# Patient Record
Sex: Male | Born: 1970 | Race: White | Hispanic: No | Marital: Married | Smoking: Current every day smoker
Health system: Southern US, Community
[De-identification: ages and names within clinical notes are randomized; demographics above are authoritative.]

## PROBLEM LIST (undated history)

## (undated) DIAGNOSIS — F1020 Alcohol dependence, uncomplicated: Secondary | ICD-10-CM

## (undated) DIAGNOSIS — R569 Unspecified convulsions: Secondary | ICD-10-CM

## (undated) DIAGNOSIS — G4730 Sleep apnea, unspecified: Secondary | ICD-10-CM

## (undated) HISTORY — PX: APPENDECTOMY: SHX54

## (undated) HISTORY — PX: OTHER SURGICAL HISTORY: SHX169

---

## 2012-04-15 ENCOUNTER — Emergency Department (HOSPITAL_COMMUNITY)
Admission: EM | Admit: 2012-04-15 | Discharge: 2012-04-15 | Disposition: A | Discharge status: Home / Self Care | Payer: Medicare Other | Attending: Emergency Medicine | Admitting: Emergency Medicine

## 2012-04-15 ENCOUNTER — Emergency Department (HOSPITAL_COMMUNITY): Payer: Medicare Other

## 2012-04-15 ENCOUNTER — Encounter (HOSPITAL_COMMUNITY): Payer: Self-pay

## 2012-04-15 DIAGNOSIS — F17200 Nicotine dependence, unspecified, uncomplicated: Secondary | ICD-10-CM | POA: Insufficient documentation

## 2012-04-15 DIAGNOSIS — G4730 Sleep apnea, unspecified: Secondary | ICD-10-CM | POA: Insufficient documentation

## 2012-04-15 DIAGNOSIS — R42 Dizziness and giddiness: Secondary | ICD-10-CM | POA: Insufficient documentation

## 2012-04-15 DIAGNOSIS — R079 Chest pain, unspecified: Secondary | ICD-10-CM | POA: Insufficient documentation

## 2012-04-15 DIAGNOSIS — R0602 Shortness of breath: Secondary | ICD-10-CM | POA: Insufficient documentation

## 2012-04-15 DIAGNOSIS — R569 Unspecified convulsions: Secondary | ICD-10-CM | POA: Insufficient documentation

## 2012-04-15 DIAGNOSIS — Z79899 Other long term (current) drug therapy: Secondary | ICD-10-CM | POA: Insufficient documentation

## 2012-04-15 HISTORY — DX: Unspecified convulsions: R56.9

## 2012-04-15 HISTORY — DX: Sleep apnea, unspecified: G47.30

## 2012-04-15 HISTORY — DX: Alcohol dependence, uncomplicated: F10.20

## 2012-04-15 LAB — CBC
MCH: 29.5 pg (ref 26.0–34.0)
MCHC: 34.1 g/dL (ref 30.0–36.0)
MCV: 86.6 fL (ref 78.0–100.0)
Platelets: 228 10*3/uL (ref 150–400)
RDW: 13.4 % (ref 11.5–15.5)

## 2012-04-15 LAB — BASIC METABOLIC PANEL
CO2: 19 mEq/L (ref 19–32)
Calcium: 8.7 mg/dL (ref 8.4–10.5)
Creatinine, Ser: 1.02 mg/dL (ref 0.50–1.35)
Glucose, Bld: 85 mg/dL (ref 70–99)

## 2012-04-15 LAB — URINALYSIS, ROUTINE W REFLEX MICROSCOPIC
Glucose, UA: NEGATIVE mg/dL
Hgb urine dipstick: NEGATIVE
Ketones, ur: NEGATIVE mg/dL
Leukocytes, UA: NEGATIVE
Protein, ur: NEGATIVE mg/dL

## 2012-04-15 MED ORDER — SODIUM CHLORIDE 0.9 % IV BOLUS (SEPSIS)
500.0000 mL | Freq: Once | INTRAVENOUS | Status: AC
  Administered 2012-04-15: 500 mL via INTRAVENOUS

## 2012-04-15 NOTE — ED Provider Notes (Signed)
History     CSN: [id number]  Arrival date & time 04/15/12  1420   First MD Initiated Contact with Patient 04/15/12 1537      Chief Complaint  Patient presents with  . Weakness  . Dizziness  . Dehydration    (Consider location/radiation/quality/duration/timing/severity/associated sxs/prior treatment) HPI Comments: Patient presents today with a chief complaint of chest pain.  He reports that the pain came on suddenly this morning around 11:30 AM while he was in class.  He was not doing anything exertional at the onset of pain.  Pain located in the substernal region and did not radiate.  He describes the pain by stating, "It felt like someone kicked me in the chest."  Pain lasted approximately 30 minutes and then resolved without intervention.  He states that he felt like he had difficulty catching his breath at the time of this pain.  He also had some lightheadedness associated with this pain.  He denies numbness or tingling.  No nausea or vomiting.  He was seen by [facility] and given ASA.  No prior cardiac history.  No history of HTN, Hyperlipidemia, or DM.  He reports that his father had a MI when he was in his 82's.  He has never had any cardiac testing.  He denies prolonged travel or surgery in the past 4 weeks.  No prior history of DVT or PE.  No history of Cancer.  He denies LE edema or pain.    The history is provided by the patient.    Past Medical History  Diagnosis Date  . Seizures   . EtOH dependence     quit 2 years ago  . Sleep apnea     Past Surgical History  Procedure Date  . Vns     nerve stimulator for seizure- [facility]  . Appendectomy     No family history on file.  History  Substance Use Topics  . Smoking status: Current Every Day Smoker  . Smokeless tobacco: Not on file  . Alcohol Use: No      Review of Systems  Constitutional: Negative for fever and chills.  HENT: Negative for neck pain and neck stiffness.   Respiratory: Positive for shortness of  breath. Negative for cough and wheezing.   Cardiovascular: Positive for chest pain. Negative for palpitations and leg swelling.  Gastrointestinal: Negative for nausea, vomiting and abdominal pain.  Neurological: Positive for dizziness and light-headedness. Negative for syncope, numbness and headaches.    Allergies  Cephalosporins  Home Medications   Current Outpatient Rx  Name  Route  Sig  Dispense  Refill  . LAMOTRIGINE 200 MG PO TABS   Oral   Take 200 mg by mouth 2 (two) times daily.         Marland Kitchen PREGABALIN 150 MG PO CAPS   Oral   Take 150 mg by mouth 2 (two) times daily.         . TOPIRAMATE 100 MG PO TABS   Oral   Take 200 mg by mouth 2 (two) times daily.           BP 111/74  Pulse 75  Temp 98.3 F (36.8 C) (Oral)  Resp 20  SpO2 100%  Physical Exam  Nursing note and vitals reviewed. Constitutional: He appears well-nourished. No distress.  HENT:  Head: Normocephalic and atraumatic.  Mouth/Throat: Oropharynx is clear and moist.  Neck: Normal range of motion. Neck supple.  Cardiovascular: Normal rate, regular rhythm, normal heart sounds and intact  distal pulses.   Pulmonary/Chest: Effort normal and breath sounds normal. No respiratory distress. He has no wheezes. He has no rales. He exhibits no tenderness.  Abdominal: Soft. There is no tenderness.  Musculoskeletal:       No LE edema or pain  Neurological: He is alert.  Skin: Skin is warm and dry. No rash noted. He is not diaphoretic.  Psychiatric: He has a normal mood and affect.    ED Course  Procedures (including critical care time)  Labs Reviewed  BASIC METABOLIC PANEL - Abnormal; Notable for the following:    GFR calc non Af Amer 90 (*)     All other components within normal limits  URINALYSIS, ROUTINE W REFLEX MICROSCOPIC  CBC  TROPONIN I  GLUCOSE, CAPILLARY   Dg Chest Portable 1 View  04/15/2012  *RADIOLOGY REPORT*  Clinical Data: Weakness.  Dizziness.  Dehydration.  Chest pain.  PORTABLE  CHEST - 1 VIEW  Comparison: None.  Findings:  Cardiopericardial silhouette within normal limits. Mediastinal contours normal. Trachea midline.  No airspace disease or effusion.  Vagal nerve stimulator is present in the left chest.  IMPRESSION: No active cardiopulmonary disease.   Original Report Authenticated By: Andreas Newport, M.D.      No diagnosis found.   Date: 04/15/2012  Rate: 79  Rhythm: normal sinus rhythm  QRS Axis: normal  Intervals: normal  ST/T Wave abnormalities: normal  Conduction Disutrbances:none  Narrative Interpretation:   Old EKG Reviewed: none available  Patient discussed with Dr. Rulon Abide who also evaluated the patient.    MDM  Patient discharged with recommendation to follow up with PCP in regards to today's hospital visit for an outpatient stress test. Chest pain is not likely of cardiac or pulmonary etiology d/t presentation, PERC negative, VSS, heart RRR, breath sounds equal bilaterally, EKG without acute abnormalities, negative initial and 3 hour troponin, and negative CXR.  Return precautions discussed with patient.  Pt appears reliable for follow up and is agreeable to discharge.   Case has been discussed with and seen by Dr. Rulon Abide who agrees with the above plan to discharge.         Pascal Lux Brilliant, PA-C 04/15/12 2237

## 2012-04-15 NOTE — ED Notes (Signed)
MD at bedside. 

## 2012-04-15 NOTE — ED Notes (Signed)
MVH:[id number]<GE> Expected date:04/15/12<BR> Expected time: 1:55 PM<BR> Means of arrival:<BR> Comments:<BR> Syncope/positive orthostatics

## 2012-04-15 NOTE — ED Notes (Signed)
[facility] ASA given on scene

## 2012-04-15 NOTE — ED Notes (Signed)
Family at bedside.  Requesting to see EDP

## 2012-04-15 NOTE — ED Notes (Signed)
Per [facility]- Pt student at [facility]- in class while sitting pt c/o of center CP lasting . Pt positive orthostatic.

## 2012-04-15 NOTE — ED Provider Notes (Signed)
Medical screening examination/treatment/procedure(s) were performed by non-physician practitioner and as supervising physician I was immediately available for consultation/collaboration.  Jones Skene, M.D.     Jones Skene, MD 04/15/12 2308

## 2020-11-02 ENCOUNTER — Emergency Department (HOSPITAL_BASED_OUTPATIENT_CLINIC_OR_DEPARTMENT_OTHER)
Admission: EM | Admit: 2020-11-02 | Discharge: 2020-11-02 | Disposition: A | Discharge status: Home / Self Care | Payer: Medicare Other | Attending: Emergency Medicine | Admitting: Emergency Medicine

## 2020-11-02 ENCOUNTER — Emergency Department (HOSPITAL_BASED_OUTPATIENT_CLINIC_OR_DEPARTMENT_OTHER): Payer: Medicare Other

## 2020-11-02 DIAGNOSIS — R569 Unspecified convulsions: Secondary | ICD-10-CM | POA: Insufficient documentation

## 2020-11-02 DIAGNOSIS — F17200 Nicotine dependence, unspecified, uncomplicated: Secondary | ICD-10-CM | POA: Diagnosis not present

## 2020-11-02 LAB — CBC WITH DIFFERENTIAL/PLATELET
Abs Immature Granulocytes: 0.02 10*3/uL (ref 0.00–0.07)
Basophils Absolute: 0.1 10*3/uL (ref 0.0–0.1)
Basophils Relative: 1 %
Eosinophils Absolute: 0 10*3/uL (ref 0.0–0.5)
Eosinophils Relative: 0 %
HCT: 42.7 % (ref 39.0–52.0)
Hemoglobin: 14.5 g/dL (ref 13.0–17.0)
Immature Granulocytes: 0 %
Lymphocytes Relative: 16 %
Lymphs Abs: 1.2 10*3/uL (ref 0.7–4.0)
MCH: 33 pg (ref 26.0–34.0)
MCHC: 34 g/dL (ref 30.0–36.0)
MCV: 97 fL (ref 80.0–100.0)
Monocytes Absolute: 0.5 10*3/uL (ref 0.1–1.0)
Monocytes Relative: 7 %
Neutro Abs: 5.5 10*3/uL (ref 1.7–7.7)
Neutrophils Relative %: 76 %
Platelets: 198 10*3/uL (ref 150–400)
RBC: 4.4 MIL/uL (ref 4.22–5.81)
RDW: 12.9 % (ref 11.5–15.5)
WBC: 7.3 10*3/uL (ref 4.0–10.5)
nRBC: 0 % (ref 0.0–0.2)

## 2020-11-02 LAB — BASIC METABOLIC PANEL
Anion gap: 10 (ref 5–15)
BUN: 17 mg/dL (ref 6–20)
CO2: 21 mmol/L — ABNORMAL LOW (ref 22–32)
Calcium: 8.9 mg/dL (ref 8.9–10.3)
Chloride: 105 mmol/L (ref 98–111)
Creatinine, Ser: 0.96 mg/dL (ref 0.61–1.24)
GFR, Estimated: >60 mL/min (ref >60)
Glucose, Bld: 122 mg/dL — ABNORMAL HIGH (ref 70–99)
Potassium: 3.6 mmol/L (ref 3.5–5.1)
Sodium: 136 mmol/L (ref 135–145)

## 2020-11-02 LAB — URINALYSIS, ROUTINE W REFLEX MICROSCOPIC
Bilirubin Urine: NEGATIVE
Glucose, UA: NEGATIVE mg/dL
Ketones, ur: NEGATIVE mg/dL
Leukocytes,Ua: NEGATIVE
Nitrite: NEGATIVE
Protein, ur: NEGATIVE mg/dL
Specific Gravity, Urine: 1.025 (ref 1.005–1.030)
pH: 5.5 (ref 5.0–8.0)

## 2020-11-02 LAB — URINALYSIS, MICROSCOPIC (REFLEX)

## 2020-11-02 NOTE — ED Notes (Signed)
Patient transported to CT 

## 2020-11-02 NOTE — ED Notes (Signed)
Pt provided with clean pants d/t incontinence pta.

## 2020-11-02 NOTE — ED Triage Notes (Signed)
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  EMS called to transport. No known LOC

## 2020-11-02 NOTE — Discharge Instructions (Signed)
Follow-up with your neurologist  Continue taking your seizure medication   Return for new or worsening symptoms

## 2020-11-02 NOTE — ED Provider Notes (Signed)
[facility] EMERGENCY DEPARTMENT Provider Note   CSN: [id number] Arrival date & time: 11/02/20  1241    History Chief Complaint  Patient presents with  . Seizures    Louis BushyBrian Nunez is a 50 y.o. male with medical history significant for EtOH use, in remission x2 years, seizures who presents for evaluation of seizure-like activity.  Was at work when he had witnessed seizure.  He had episode of urinary incontinence without tongue biting.  Postictal on EMS arrival.  Patient states he has been compliant with his medications.  Last tetanus within the last year.  He has abrasion to bridge of his nose.  No active bleeding or drainage. Mild tenderness over nasal bone. No sudden onset headache, lightheadedness, dizziness, emesis, neck pain, chest pain, shortness of breath abdominal pain, or dysuria.  No recent illnesses.  He denies any chronic EtOH use, drug use.  Does admit to using tobacco.  Patient denies any syncopal events.  Denies additional aggravating relieving factors.  Denies any current pain.  History obtained from patient and past medical records.  No interpreter used.  Neuro- Dr. Tera HelperBoggs with [facility] VNS implant Dr. Samson Fredericouture 05/2019  HPI     Past Medical History:  Diagnosis Date  . EtOH dependence (HCC)    quit 2 years ago  . Seizures (HCC)   . Sleep apnea     There are no problems to display for this patient.   Past Surgical History:  Procedure Laterality Date  . APPENDECTOMY    . VNS     nerve stimulator for seizure- [facility]       No family history on file.  Social History   Tobacco Use  . Smoking status: Current Every Day Smoker  Substance Use Topics  . Alcohol use: No  . Drug use: No    Home Medications Prior to Admission medications   Medication Sig Start Date End Date Taking? Authorizing Provider  lamoTRIgine (LAMICTAL) 100 MG tablet Take 300 mg by mouth 2 (two) times daily. 10/28/20   [provider]  levETIRAcetam (KEPPRA) 750 MG tablet  Take 1,500 mg by mouth 2 (two) times daily. 10/16/20   [provider]  topiramate (TOPAMAX) 200 MG tablet Take 200 mg by mouth 2 (two) times daily. 07/21/20   [provider]    Allergies    Cephalosporins and Sulfa antibiotics  Review of Systems   Review of Systems  Constitutional: Negative.   HENT: Negative.   Respiratory: Negative.   Cardiovascular: Negative.   Gastrointestinal: Negative.   Genitourinary: Negative.   Musculoskeletal: Negative.   Skin: Negative.   Neurological: Positive for seizures. Negative for dizziness, tremors, syncope, facial asymmetry, speech difficulty, weakness, light-headedness, numbness and headaches.  All other systems reviewed and are negative.   Physical Exam Updated Vital Signs BP 115/73   Pulse 64   Temp 97.9 F (36.6 C) (Oral)   Resp 15   Ht 5\' 9"  (1.753 m)   Wt 72.6 kg   SpO2 98%   BMI 23.63 kg/m   Physical Exam Eyes:     Pupils: Pupils are equal, round, and reactive to light.  Musculoskeletal:        General: Normal range of motion.     Physical Exam  Constitutional: Pt is oriented to person, place, and time. Pt appears well-developed and well-nourished. No distress.  HENT:  Head: Normocephalic. Abrasion to bridge nose. Mouth/Throat: Oropharynx is clear and moist.  No tongue injury Eyes: Conjunctivae and EOM are  normal. Pupils are equal, round, and reactive to light. No scleral icterus.  No horizontal, vertical or rotational nystagmus  Neck: Normal range of motion. Neck supple.  Full active and passive ROM without pain No midline or paraspinal tenderness No nuchal rigidity or meningeal signs  Cardiovascular: Normal rate, regular rhythm and intact distal pulses.   Pulmonary/Chest: Effort normal and breath sounds normal. No respiratory distress. Pt has no wheezes. No rales.  Abdominal: Soft. Bowel sounds are normal. There is no tenderness. There is no rebound and no guarding.  Musculoskeletal: Normal range of  motion.  Lymphadenopathy:    No cervical adenopathy.  Neurological: Pt. is alert and oriented to person, place, and time. He has normal reflexes. No cranial nerve deficit.  Exhibits normal muscle tone. Coordination normal.  Mental Status:  Alert, oriented, thought content appropriate. Speech fluent without evidence of aphasia. Able to follow 2 step commands without difficulty.  Cranial Nerves:  II:  Peripheral visual fields grossly normal, pupils equal, round, reactive to light III,IV, VI: ptosis not present, extra-ocular motions intact bilaterally  V,VII: smile symmetric, facial light touch sensation equal VIII: hearing grossly normal bilaterally  IX,X: midline uvula rise  XI: bilateral shoulder shrug equal and strong XII: midline tongue extension  Motor:  5/5 in upper and lower extremities bilaterally including strong and equal grip strength and dorsiflexion/plantar flexion Sensory: Pinprick and light touch normal in all extremities.  Deep Tendon Reflexes: 2+ and symmetric  Cerebellar: normal finger-to-nose with bilateral upper extremities Gait: normal gait and balance CV: distal pulses palpable throughout   Skin: Skin is warm and dry. No rash noted. Pt is not diaphoretic.  Psychiatric: Pt has a normal mood and affect. Behavior is normal. Judgment and thought content normal.  Nursing note and vitals reviewed. ED Results / Procedures / Treatments   Labs (all labs ordered are listed, but only abnormal results are displayed) Labs Reviewed  BASIC METABOLIC PANEL - Abnormal; Notable for the following components:      Result Value   CO2 21 (*)    Glucose, Bld 122 (*)    All other components within normal limits  URINALYSIS, ROUTINE W REFLEX MICROSCOPIC - Abnormal; Notable for the following components:   Hgb urine dipstick TRACE (*)    All other components within normal limits  URINALYSIS, MICROSCOPIC (REFLEX) - Abnormal; Notable for the following components:   Bacteria, UA RARE (*)     All other components within normal limits  CBC WITH DIFFERENTIAL/PLATELET  LAMOTRIGINE LEVEL  LEVETIRACETAM LEVEL    EKG EKG Interpretation  Date/Time:  Thursday Nov 02 2020 13:26:31 EDT Ventricular Rate:  72 PR Interval:  130 QRS Duration: 89 QT Interval:  412 QTC Calculation: 451 R Axis:   48 Text Interpretation: Sinus rhythm. T waves taller than prior. Noother acute change. ST elev, probable normal early repol pattern Confirmed by Alona Bene [phone]) on 11/02/2020 1:30:55 PM   Radiology DG Chest 2 View  Result Date: 11/02/2020 CLINICAL DATA:  Syncope.  Witness seizure today while mowing yard. EXAM: CHEST - 2 VIEW COMPARISON:  Prior chest radiographs 04/15/2012. FINDINGS: A vagal nerve stimulator overlies the left chest and left lower neck. Heart size within normal limits. No appreciable airspace consolidation. No evidence of pleural effusion or pneumothorax. No acute bony abnormality identified. IMPRESSION: No evidence of active cardiopulmonary disease. Electronically Signed   By: Jackey Loge DO   On: 11/02/2020 13:50   CT Head Wo Contrast  Result Date: 11/02/2020 CLINICAL DATA:  Seizure,  nontraumatic. Seizure, fall, head injury. Facial trauma, seizure, fall, nasal tenderness. EXAM: CT HEAD WITHOUT CONTRAST CT MAXILLOFACIAL WITHOUT CONTRAST CT CERVICAL SPINE WITHOUT CONTRAST TECHNIQUE: Multidetector CT imaging of the head, cervical spine, and maxillofacial structures were performed using the standard protocol without intravenous contrast. Multiplanar CT image reconstructions of the cervical spine and maxillofacial structures were also generated. COMPARISON:  No pertinent prior exams available for comparison. FINDINGS: CT HEAD FINDINGS Brain: Cerebral volume is normal. There is no acute intracranial hemorrhage. No demarcated cortical infarct. No extra-axial fluid collection. No evidence of intracranial mass. No midline shift. Vascular: No hyperdense vessel.  Atherosclerotic  calcifications Skull: Normal. Negative for fracture or focal lesion. Other: No significant mastoid effusion at the imaged levels. CT MAXILLOFACIAL FINDINGS Osseous: Age-indeterminate minimally displaced fractures of the bilateral nasal bones. Linear defect through the left styloid process, which may be developmental (series 6, image 16). An acute fracture at this site cannot be excluded. No other maxillofacial fracture is identified. Orbits: No acute finding within the orbits. The globes are normal in size and contour. The extraocular muscles and optic nerve sheath complexes are symmetric and unremarkable. Sinuses: Trace mucosal thickening within the bilateral maxillary sinuses. Soft tissues: No significant maxillofacial soft tissue swelling appreciable by CT. CT CERVICAL SPINE FINDINGS Alignment: No significant spondylolisthesis. Skull base and vertebrae: The basion-dental and atlanto-dental intervals are maintained.No evidence of acute fracture to the cervical spine. Soft tissues and spinal canal: No prevertebral fluid or swelling. No visible canal hematoma. Disc levels: Cervical spondylosis. No more than mild disc space narrowing at any level. Shallow multilevel disc bulges. No appreciable high-grade spinal canal stenosis. No compressive bony neural foraminal narrowing. Upper chest: No consolidation within the imaged lung apices. Other: Partially imaged vagal nerve stimulator within the left lower neck/left chest. IMPRESSION: CT head: No evidence of acute intracranial abnormality. CT maxillofacial: 1. Age-indeterminate minimally displaced fractures of the bilateral nasal bones. 2. Linear defect through the left styloid process. This may be developmental. However, an acute fracture at this site cannot be excluded and clinical correlation is recommended. 3. Trace bilateral maxillary sinus mucosal thickening. CT cervical spine: 1. No evidence of acute fracture to the cervical spine. 2. Cervical spondylosis, as  described. Electronically Signed   By: Jackey Loge DO   On: 11/02/2020 13:49   CT Cervical Spine Wo Contrast  Result Date: 11/02/2020 CLINICAL DATA:  Seizure, nontraumatic. Seizure, fall, head injury. Facial trauma, seizure, fall, nasal tenderness. EXAM: CT HEAD WITHOUT CONTRAST CT MAXILLOFACIAL WITHOUT CONTRAST CT CERVICAL SPINE WITHOUT CONTRAST TECHNIQUE: Multidetector CT imaging of the head, cervical spine, and maxillofacial structures were performed using the standard protocol without intravenous contrast. Multiplanar CT image reconstructions of the cervical spine and maxillofacial structures were also generated. COMPARISON:  No pertinent prior exams available for comparison. FINDINGS: CT HEAD FINDINGS Brain: Cerebral volume is normal. There is no acute intracranial hemorrhage. No demarcated cortical infarct. No extra-axial fluid collection. No evidence of intracranial mass. No midline shift. Vascular: No hyperdense vessel.  Atherosclerotic calcifications Skull: Normal. Negative for fracture or focal lesion. Other: No significant mastoid effusion at the imaged levels. CT MAXILLOFACIAL FINDINGS Osseous: Age-indeterminate minimally displaced fractures of the bilateral nasal bones. Linear defect through the left styloid process, which may be developmental (series 6, image 16). An acute fracture at this site cannot be excluded. No other maxillofacial fracture is identified. Orbits: No acute finding within the orbits. The globes are normal in size and contour. The extraocular muscles and optic nerve sheath complexes  are symmetric and unremarkable. Sinuses: Trace mucosal thickening within the bilateral maxillary sinuses. Soft tissues: No significant maxillofacial soft tissue swelling appreciable by CT. CT CERVICAL SPINE FINDINGS Alignment: No significant spondylolisthesis. Skull base and vertebrae: The basion-dental and atlanto-dental intervals are maintained.No evidence of acute fracture to the cervical spine.  Soft tissues and spinal canal: No prevertebral fluid or swelling. No visible canal hematoma. Disc levels: Cervical spondylosis. No more than mild disc space narrowing at any level. Shallow multilevel disc bulges. No appreciable high-grade spinal canal stenosis. No compressive bony neural foraminal narrowing. Upper chest: No consolidation within the imaged lung apices. Other: Partially imaged vagal nerve stimulator within the left lower neck/left chest. IMPRESSION: CT head: No evidence of acute intracranial abnormality. CT maxillofacial: 1. Age-indeterminate minimally displaced fractures of the bilateral nasal bones. 2. Linear defect through the left styloid process. This may be developmental. However, an acute fracture at this site cannot be excluded and clinical correlation is recommended. 3. Trace bilateral maxillary sinus mucosal thickening. CT cervical spine: 1. No evidence of acute fracture to the cervical spine. 2. Cervical spondylosis, as described. Electronically Signed   By: Jackey Loge DO   On: 11/02/2020 13:49   CT Maxillofacial Wo Contrast  Result Date: 11/02/2020 CLINICAL DATA:  Seizure, nontraumatic. Seizure, fall, head injury. Facial trauma, seizure, fall, nasal tenderness. EXAM: CT HEAD WITHOUT CONTRAST CT MAXILLOFACIAL WITHOUT CONTRAST CT CERVICAL SPINE WITHOUT CONTRAST TECHNIQUE: Multidetector CT imaging of the head, cervical spine, and maxillofacial structures were performed using the standard protocol without intravenous contrast. Multiplanar CT image reconstructions of the cervical spine and maxillofacial structures were also generated. COMPARISON:  No pertinent prior exams available for comparison. FINDINGS: CT HEAD FINDINGS Brain: Cerebral volume is normal. There is no acute intracranial hemorrhage. No demarcated cortical infarct. No extra-axial fluid collection. No evidence of intracranial mass. No midline shift. Vascular: No hyperdense vessel.  Atherosclerotic calcifications Skull:  Normal. Negative for fracture or focal lesion. Other: No significant mastoid effusion at the imaged levels. CT MAXILLOFACIAL FINDINGS Osseous: Age-indeterminate minimally displaced fractures of the bilateral nasal bones. Linear defect through the left styloid process, which may be developmental (series 6, image 16). An acute fracture at this site cannot be excluded. No other maxillofacial fracture is identified. Orbits: No acute finding within the orbits. The globes are normal in size and contour. The extraocular muscles and optic nerve sheath complexes are symmetric and unremarkable. Sinuses: Trace mucosal thickening within the bilateral maxillary sinuses. Soft tissues: No significant maxillofacial soft tissue swelling appreciable by CT. CT CERVICAL SPINE FINDINGS Alignment: No significant spondylolisthesis. Skull base and vertebrae: The basion-dental and atlanto-dental intervals are maintained.No evidence of acute fracture to the cervical spine. Soft tissues and spinal canal: No prevertebral fluid or swelling. No visible canal hematoma. Disc levels: Cervical spondylosis. No more than mild disc space narrowing at any level. Shallow multilevel disc bulges. No appreciable high-grade spinal canal stenosis. No compressive bony neural foraminal narrowing. Upper chest: No consolidation within the imaged lung apices. Other: Partially imaged vagal nerve stimulator within the left lower neck/left chest. IMPRESSION: CT head: No evidence of acute intracranial abnormality. CT maxillofacial: 1. Age-indeterminate minimally displaced fractures of the bilateral nasal bones. 2. Linear defect through the left styloid process. This may be developmental. However, an acute fracture at this site cannot be excluded and clinical correlation is recommended. 3. Trace bilateral maxillary sinus mucosal thickening. CT cervical spine: 1. No evidence of acute fracture to the cervical spine. 2. Cervical spondylosis, as described. Electronically  Signed   By: Jackey Loge DO   On: 11/02/2020 13:49    Procedures Procedures   Medications Ordered in ED Medications - No data to display  ED Course  I have reviewed the triage vital signs and the nursing notes.  Pertinent labs & imaging results that were available during my care of the patient were reviewed by me and considered in my medical decision making (see chart for details).  Patient here for evaluation.  Known history of epilepsy.  States he has not missed any of his medications.  On arrival he is afebrile, nonseptic, non-ill-appearing.  Initially was postictal with EMS however arrival patient ANO x4.  He has a nonfocal neuro exam without deficits.  Does have abrasion to bridge of his nose with some mild tenderness. Tetanus up to date We will plan on labs, imaging and reassess.  Of note patient does have history of prior EtOH dependence.  Patient states he has been clean for 2 years.  He does not appear to be actively withdrawing at this time.  Labs and imaging personally reviewed and interpreted:  CBC without leukocytosis BMP glucose 122, CO2 21, no additional electrolyte, renal or liver abnormality UA without evidence of infection Keppra pending  Lamictal pending  CT head wo acute findings CT max face possible age-indeterminate nasal bone fractures, 80s indeterminate styloid fracture. CT cervical without significant findings DG chest without significant findings EKG wo STEMI, ischemic changes  Patient reassessed.  Discussed labs and imaging.  Has had previous fracture to styloid and nasal bone.  Likely old injuries as he is nontender on exam.  Patient observed here in the emergency department for approximately 4 hours without any additional seizure-like activity.  Keppra and Lamictal levels pending as they are send out test.  Discussed close follow-up with neurology.  No evidence of infectious process lowering patient's seizure threshold.  DC home in stable condition.  The  patient has been appropriately medically screened and/or stabilized in the ED. I have low suspicion for any other emergent medical condition which would require further screening, evaluation or treatment in the ED or require inpatient management.  Patient is hemodynamically stable and in no acute distress.  Patient able to ambulate in department prior to ED.  Evaluation does not show acute pathology that would require ongoing or additional emergent interventions while in the emergency department or further inpatient treatment.  I have discussed the diagnosis with the patient and answered all questions.  Pain is been managed while in the emergency department and patient has no further complaints prior to discharge.  Patient is comfortable with plan discussed in room and is stable for discharge at this time.  I have discussed strict return precautions for returning to the emergency department.  Patient was encouraged to follow-up with PCP/specialist refer to at discharge.     MDM Rules/Calculators/A&P                           Final Clinical Impression(s) / ED Diagnoses Final diagnoses:  Seizure [facility])    Rx / DC Orders ED Discharge Orders    None       Ravan Schlemmer A, PA-C 11/02/20 1633    Long, Arlyss Repress, MD 11/04/20 [phone]

## 2020-11-02 NOTE — ED Notes (Signed)
Pt requesting something to eat, provided with cheese and crackers with provider approval.

## 2020-11-03 LAB — LAMOTRIGINE LEVEL: Lamotrigine Lvl: 4 ug/mL (ref 2.0–20.0)

## 2020-11-05 LAB — LEVETIRACETAM LEVEL: Levetiracetam Lvl: 1.3 ug/mL — ABNORMAL LOW (ref 10.0–40.0)

## 2021-10-13 ENCOUNTER — Other Ambulatory Visit: Payer: Self-pay

## 2021-10-13 ENCOUNTER — Emergency Department (HOSPITAL_BASED_OUTPATIENT_CLINIC_OR_DEPARTMENT_OTHER)
Admission: EM | Admit: 2021-10-13 | Discharge: 2021-10-13 | Disposition: A | Discharge status: Home / Self Care | Payer: Medicare Other | Attending: Emergency Medicine | Admitting: Emergency Medicine

## 2021-10-13 ENCOUNTER — Encounter (HOSPITAL_BASED_OUTPATIENT_CLINIC_OR_DEPARTMENT_OTHER): Payer: Self-pay | Admitting: Emergency Medicine

## 2021-10-13 DIAGNOSIS — G40909 Epilepsy, unspecified, not intractable, without status epilepticus: Secondary | ICD-10-CM | POA: Diagnosis not present

## 2021-10-13 DIAGNOSIS — R569 Unspecified convulsions: Secondary | ICD-10-CM

## 2021-10-13 DIAGNOSIS — R7309 Other abnormal glucose: Secondary | ICD-10-CM | POA: Insufficient documentation

## 2021-10-13 LAB — COMPREHENSIVE METABOLIC PANEL
ALT: 24 U/L (ref 0–44)
AST: 30 U/L (ref 15–41)
Albumin: 3.9 g/dL (ref 3.5–5.0)
Alkaline Phosphatase: 43 U/L (ref 38–126)
Anion gap: 10 (ref 5–15)
BUN: 18 mg/dL (ref 6–20)
CO2: 20 mmol/L — ABNORMAL LOW (ref 22–32)
Calcium: 8.6 mg/dL — ABNORMAL LOW (ref 8.9–10.3)
Chloride: 110 mmol/L (ref 98–111)
Creatinine, Ser: 0.87 mg/dL (ref 0.61–1.24)
GFR, Estimated: >60 mL/min (ref >60)
Glucose, Bld: 115 mg/dL — ABNORMAL HIGH (ref 70–99)
Potassium: 3.7 mmol/L (ref 3.5–5.1)
Sodium: 140 mmol/L (ref 135–145)
Total Bilirubin: 0.6 mg/dL (ref 0.3–1.2)
Total Protein: 6.5 g/dL (ref 6.5–8.1)

## 2021-10-13 LAB — CBC WITH DIFFERENTIAL/PLATELET
Abs Immature Granulocytes: 0.02 10*3/uL (ref 0.00–0.07)
Basophils Absolute: 0.1 10*3/uL (ref 0.0–0.1)
Basophils Relative: 1 %
Eosinophils Absolute: 0.1 10*3/uL (ref 0.0–0.5)
Eosinophils Relative: 1 %
HCT: 41.3 % (ref 39.0–52.0)
Hemoglobin: 13.8 g/dL (ref 13.0–17.0)
Immature Granulocytes: 0 %
Lymphocytes Relative: 23 %
Lymphs Abs: 1.5 10*3/uL (ref 0.7–4.0)
MCH: 31.4 pg (ref 26.0–34.0)
MCHC: 33.4 g/dL (ref 30.0–36.0)
MCV: 93.9 fL (ref 80.0–100.0)
Monocytes Absolute: 0.6 10*3/uL (ref 0.1–1.0)
Monocytes Relative: 9 %
Neutro Abs: 4.5 10*3/uL (ref 1.7–7.7)
Neutrophils Relative %: 66 %
Platelets: 205 10*3/uL (ref 150–400)
RBC: 4.4 MIL/uL (ref 4.22–5.81)
RDW: 13.2 % (ref 11.5–15.5)
WBC: 6.8 10*3/uL (ref 4.0–10.5)
nRBC: 0 % (ref 0.0–0.2)

## 2021-10-13 LAB — CBG MONITORING, ED: Glucose-Capillary: 111 mg/dL — ABNORMAL HIGH (ref 70–99)

## 2021-10-13 MED ORDER — SODIUM CHLORIDE 0.9 % IV SOLN: INTRAVENOUS | Status: DC | PRN

## 2021-10-13 MED ORDER — LEVETIRACETAM IN NACL 1000 MG/100ML IV SOLN
1000.0000 mg | Freq: Once | INTRAVENOUS | Status: AC
  Administered 2021-10-13: 1000 mg via INTRAVENOUS
  Filled 2021-10-13: qty 100

## 2021-10-13 NOTE — ED Provider Notes (Signed)
?[facility] EMERGENCY DEPARTMENT ?Provider Note ? ? ?CSN: [id number] ?Arrival date & time: 10/13/21  1050 ? ?  ? ?History ? ?Chief Complaint  ?Patient presents with  ? Seizures  ? ? ?Louis Nunez is a 51 y.o. male. ? ?HPI ? ?  ? ?51 year old male comes in with chief complaint of seizures. ?Patient has history of seizure for which she is on Lamictal and Keppra.  Patient states that his seizure is actually well controlled.  ?Patient brought in here by EMS after he was noted to have had a seizure while at a gas station.  Patient denies any history of alcohol use disorder, substance use disorder.  He states that he has been taking his medications as prescribed.  Patient does indicate that he has been under more stress recently as he is in the process of separating and also has not slept well over the last several weeks. ? ?Patient denies any significant pain from trauma.  He denies any severe headaches, neck pain, nausea, vomiting, vision change, focal weakness or numbness. ? ?Home Medications ?Prior to Admission medications   ?Medication Sig Start Date End Date Taking? Authorizing Provider  ?lamoTRIgine (LAMICTAL) 100 MG tablet Take 300 mg by mouth 2 (two) times daily. 10/28/20   [provider]  ?levETIRAcetam (KEPPRA) 750 MG tablet Take 1,500 mg by mouth 2 (two) times daily. 10/16/20   [provider]  ?topiramate (TOPAMAX) 200 MG tablet Take 200 mg by mouth 2 (two) times daily. 07/21/20   [provider]  ?   ? ?Allergies    ?Cephalosporins and Sulfa antibiotics   ? ?Review of Systems   ?Review of Systems ? ?Physical Exam ?Updated Vital Signs ?BP 117/78 (BP Location: Left Arm)   Pulse 67   Temp 98.4 ?F (36.9 ?C) (Oral)   Resp 20   Ht 5\' 9"  (1.753 m)   Wt 68 kg   SpO2 98%   BMI 22.15 kg/m?  ?Physical Exam ?Vitals and nursing note reviewed.  ?Constitutional:   ?   Appearance: He is well-developed.  ?HENT:  ?   Head: Atraumatic.  ?   Mouth/Throat:  ?   Comments: No bleeding from  tongue ?Eyes:  ?   Extraocular Movements: Extraocular movements intact.  ?   Pupils: Pupils are equal, round, and reactive to light.  ?Cardiovascular:  ?   Rate and Rhythm: Normal rate.  ?Pulmonary:  ?   Effort: Pulmonary effort is normal.  ?Musculoskeletal:     ?   General: No deformity.  ?   Cervical back: Neck supple.  ?Skin: ?   General: Skin is warm.  ?Neurological:  ?   Mental Status: He is alert and oriented to person, place, and time.  ? ? ?ED Results / Procedures / Treatments   ?Labs ?(all labs ordered are listed, but only abnormal results are displayed) ?Labs Reviewed  ?COMPREHENSIVE METABOLIC PANEL - Abnormal; Notable for the following components:  ?    Result Value  ? CO2 20 (*)   ? Glucose, Bld 115 (*)   ? Calcium 8.6 (*)   ? All other components within normal limits  ?CBG MONITORING, ED - Abnormal; Notable for the following components:  ? Glucose-Capillary 111 (*)   ? All other components within normal limits  ?CBC WITH DIFFERENTIAL/PLATELET  ? ? ?EKG ?None ? ?Radiology ?No results found. ? ?Procedures ?Procedures  ? ? ?Medications Ordered in ED ?Medications  ?0.9 %  sodium chloride infusion ( Intravenous New Bag/Given  10/13/21 1210)  ?levETIRAcetam (KEPPRA) IVPB 1000 mg/100 mL premix (1,000 mg Intravenous New Bag/Given 10/13/21 1212)  ? ? ?ED Course/ Medical Decision Making/ A&P ?  ?                        ?Medical Decision Making ?Amount and/or Complexity of Data Reviewed ?Labs: ordered. ? ?Risk ?Prescription drug management. ? ? ?This patient presents to the ED with chief complaint(s) of seizure with pertinent past medical history of seizure disorder. The complaint involves an extensive differential diagnosis and treatment options and also carries with it a high risk of complications and morbidity.   ? ?The differential diagnosis includes : ?DDx: ?-Seizure disorder -breakthrough seizure ?-Seizure disorder  -breakthrough seizure due to Electrolyte abnormality, Metabolic derangement, Toxin,  Medication. ? ?Clinically, it appears that lack of sleep and increased stress might have lowered the threshold and lead to breakthrough seizure ? ?The initial plan is to order basic blood work-up only.  Patient indicates that he is taking his medications as prescribed and in general has well-controlled seizure, therefore we will not get Keppra or Lamictal levels.  Will defer those test to the outpatient team ? ?From trauma perspective, patient has no headaches or any red flags for elevated ICP.  CT scan brain not indicated ? ?Additional history obtained: ?Records reviewed Care Everywhere/External Records -neurology at outside hospital ? ? ?Treatment and Reassessment: ?Patient reassessed at 1:20 PM.  No episode of seizure while here.  Feels fine.  Stable for discharge.  Results of the ED work-up discussed.  Requested that patient follow-up with his neurologist.  ? ? ?Final Clinical Impression(s) / ED Diagnoses ?Final diagnoses:  ?Seizure (HCC)  ?Seizure disorder (HCC)  ? ? ?Rx / DC Orders ?ED Discharge Orders   ? ? None  ? ?  ? ? ?  ?Derwood Kaplan, MD ?10/13/21 1329 ? ?

## 2021-10-13 NOTE — Discharge Instructions (Addendum)
We saw in the ER for seizure. ?The work-up in the ER is reassuring.  We suspect that the seizure could have been because of increased stress and poor sleep.  Ensure you try to work on mitigating your stress and getting good rest.  Continue to take your medications. ? ?Given that you are established with a neurologist and have well-controlled seizure disorder, it is recommend that you set up an appointment with them to ensure no changes to your medications are needed. ? ?If you have a repeat seizure, return to the ER for ? ?[place] law prevents people with seizures or fainting from driving or operating dangerous machinery until they are free of seizures or fainting for 6 months. ? ?

## 2021-10-13 NOTE — ED Triage Notes (Signed)
Patient brought in by EMS with c/o seizure while pumping ga. Pt has history of seizures ranging from [employer] and [employer]. Pt reports compliant with seizure medication. Pt had 2 beers last night. Per EMS patient was AAO x 3 upon their arrival and that it took about 10 mins for them to arrive. Pt denies Aura with this seizure. ?

## 2022-08-20 ENCOUNTER — Emergency Department (HOSPITAL_COMMUNITY)
Admission: EM | Admit: 2022-08-20 | Discharge: 2022-08-20 | Disposition: A | Discharge status: Home / Self Care | Payer: Medicare Other | Attending: Emergency Medicine | Admitting: Emergency Medicine

## 2022-08-20 ENCOUNTER — Emergency Department (HOSPITAL_COMMUNITY): Payer: Medicare Other

## 2022-08-20 ENCOUNTER — Other Ambulatory Visit: Payer: Self-pay

## 2022-08-20 ENCOUNTER — Encounter (HOSPITAL_COMMUNITY): Payer: Self-pay

## 2022-08-20 DIAGNOSIS — W1839XA Other fall on same level, initial encounter: Secondary | ICD-10-CM | POA: Diagnosis not present

## 2022-08-20 DIAGNOSIS — Y9389 Activity, other specified: Secondary | ICD-10-CM | POA: Insufficient documentation

## 2022-08-20 DIAGNOSIS — R569 Unspecified convulsions: Secondary | ICD-10-CM | POA: Diagnosis present

## 2022-08-20 DIAGNOSIS — S00412A Abrasion of left ear, initial encounter: Secondary | ICD-10-CM | POA: Diagnosis not present

## 2022-08-20 LAB — CBC WITH DIFFERENTIAL/PLATELET
Abs Immature Granulocytes: 0.02 10*3/uL (ref 0.00–0.07)
Basophils Absolute: 0.1 10*3/uL (ref 0.0–0.1)
Basophils Relative: 1 %
Eosinophils Absolute: 0.1 10*3/uL (ref 0.0–0.5)
Eosinophils Relative: 1 %
HCT: 40.8 % (ref 39.0–52.0)
Hemoglobin: 13.6 g/dL (ref 13.0–17.0)
Immature Granulocytes: 0 %
Lymphocytes Relative: 17 %
Lymphs Abs: 1.2 10*3/uL (ref 0.7–4.0)
MCH: 31.3 pg (ref 26.0–34.0)
MCHC: 33.3 g/dL (ref 30.0–36.0)
MCV: 94 fL (ref 80.0–100.0)
Monocytes Absolute: 0.5 10*3/uL (ref 0.1–1.0)
Monocytes Relative: 7 %
Neutro Abs: 5.5 10*3/uL (ref 1.7–7.7)
Neutrophils Relative %: 74 %
Platelets: 208 10*3/uL (ref 150–400)
RBC: 4.34 MIL/uL (ref 4.22–5.81)
RDW: 13.4 % (ref 11.5–15.5)
WBC: 7.4 10*3/uL (ref 4.0–10.5)
nRBC: 0 % (ref 0.0–0.2)

## 2022-08-20 LAB — BASIC METABOLIC PANEL
Anion gap: 13 (ref 5–15)
BUN: 15 mg/dL (ref 6–20)
CO2: 22 mmol/L (ref 22–32)
Calcium: 8.3 mg/dL — ABNORMAL LOW (ref 8.9–10.3)
Chloride: 103 mmol/L (ref 98–111)
Creatinine, Ser: 0.8 mg/dL (ref 0.61–1.24)
GFR, Estimated: >60 mL/min (ref >60)
Glucose, Bld: 107 mg/dL — ABNORMAL HIGH (ref 70–99)
Potassium: 3.3 mmol/L — ABNORMAL LOW (ref 3.5–5.1)
Sodium: 138 mmol/L (ref 135–145)

## 2022-08-20 NOTE — ED Provider Notes (Signed)
[place] EMERGENCY DEPARTMENT AT [facility] Provider Note   CSN: [id number] Arrival date & time: 08/20/22  1126     History  Chief Complaint  Patient presents with   Seizures    Louis Nunez is a 52 y.o. male.  52 year old male with prior medical history as detailed below presents via EMS transport.  Patient was at work putting a new fence.  He apparently had a brief seizure.  He was kneeling down putting up fence post when he had a seizure.  He fell forward and landed on his left ear.  He has a superficial abrasion to the left ear.  He reports a longstanding history of seizures.  Patient ports compliance with previously prescribed Lamictal, Keppra, Topamax.  Patient reports that his last seizure was over the summer in July.  [facility].  He denies significant alcohol consumption.  He reports that he generally will have a single breakthrough seizure when he does have one.  He reports that lack of sleep, stress will cause him to have a breakthrough seizure.  He thinks that he has had less sleep in the last several days secondary to stress at work and at home.  The history is provided by the patient and medical records.       Home Medications Prior to Admission medications   Medication Sig Start Date End Date Taking? Authorizing Provider  lamoTRIgine (LAMICTAL) 100 MG tablet Take 300 mg by mouth 2 (two) times daily. 10/28/20   [provider]  levETIRAcetam (KEPPRA) 750 MG tablet Take 1,500 mg by mouth 2 (two) times daily. 10/16/20   [provider]  topiramate (TOPAMAX) 200 MG tablet Take 200 mg by mouth 2 (two) times daily. 07/21/20   [provider]      Allergies    Benadryl [diphenhydramine], Cephalosporins, and Sulfa antibiotics    Review of Systems   Review of Systems  All other systems reviewed and are negative.   Physical Exam Updated Vital Signs BP 128/81 (BP Location: Right Arm)    Pulse 99   Temp 99.1 F (37.3 C) (Oral)   Resp (!) 22   Ht '5\' 6"'$  (1.676 m)   Wt 68 kg   SpO2 90%   BMI 24.20 kg/m  Physical Exam Vitals and nursing note reviewed.  Constitutional:      General: He is not in acute distress.    Appearance: Normal appearance. He is well-developed.  HENT:     Head: Normocephalic and atraumatic.     Ears:     Comments: Superficial abrasion noted to the left ear - antihelix. Eyes:     Conjunctiva/sclera: Conjunctivae normal.     Pupils: Pupils are equal, round, and reactive to light.  Cardiovascular:     Rate and Rhythm: Normal rate and regular rhythm.     Heart sounds: Normal heart sounds.  Pulmonary:     Effort: Pulmonary effort is normal. No respiratory distress.     Breath sounds: Normal breath sounds.  Abdominal:     General: There is no distension.     Palpations: Abdomen is soft.     Tenderness: There is no abdominal tenderness.  Musculoskeletal:        General: No deformity. Normal range of motion.     Cervical back: Normal range of motion and neck supple.  Skin:    General: Skin is warm and dry.  Neurological:     General: No focal  deficit present.     Mental Status: He is alert and oriented to person, place, and time.     ED Results / Procedures / Treatments   Labs (all labs ordered are listed, but only abnormal results are displayed) Labs Reviewed  BASIC METABOLIC PANEL  CBC WITH DIFFERENTIAL/PLATELET  LEVETIRACETAM LEVEL  TOPIRAMATE LEVEL  LAMOTRIGINE LEVEL    EKG None  Radiology No results found.  Procedures Procedures    Medications Ordered in ED Medications - No data to display  ED Course/ Medical Decision Making/ A&P                             Medical Decision Making Amount and/or Complexity of Data Reviewed Labs: ordered. Radiology: ordered.    Medical Screen Complete  This patient presented to the ED with complaint of breakthrough seizure.  This complaint involves an extensive number of  treatment options. The initial differential diagnosis includes, but is not limited to, seizure, abrasion, etc  This presentation is: Acute, Chronic, Self-Limited, Previously Undiagnosed, Uncertain Prognosis, Complicated, Systemic Symptoms, and Threat to Life/Bodily Function   Patient is presenting with breakthrough seizure.  Patient with longstanding history of seizures.  Last reported seizure was a approximately 3 to 4 months ago.  Patient is compliant with medications per his report.  Patient reports that this seizure today was most likely brought on by lack of sleep and stress.  He reports that he has had these episodes previously.  He did suffer a superficial abrasion after his ground-level fall associated with a seizure today.  Labs and imaging obtained are without significant abnormality.  Antiepileptic medication levels sent out and patient is aware that these will result eventually through [place].  He is aware that his neurologist can use these levels to help guide his treatment.  Patient is desiring discharge at time of discharge.  He understands need for close outpatient follow-up.  He is advised to avoid high risk activities such as driving cars, operating heavy machinery, etc. until cleared by neurology.    Additional history obtained:  Additional history obtained from EMS External records from outside sources obtained and reviewed including prior ED visits and prior Inpatient records.    Lab Tests:  I ordered and personally interpreted labs.  The pertinent results include: CBC, BMP, levels on topiramate, lamotrigine, Levetiracetam.   Imaging Studies ordered:  I ordered imaging studies including CT head I independently visualized and interpreted obtained imaging which showed NAD I agree with the radiologist interpretation.   Cardiac Monitoring:  The patient was maintained on a cardiac monitor.  I personally viewed and interpreted the cardiac monitor which  showed an underlying rhythm of: NSR  Problem List / ED Course:  Breakthrough seizure   Reevaluation:  After the interventions noted above, I reevaluated the patient and found that they have: improved   Disposition:  After consideration of the diagnostic results and the patients response to treatment, I feel that the patent would benefit from close outpatient follow-up.           Final Clinical Impression(s) / ED Diagnoses Final diagnoses:  Seizure [facility])    Rx / DC Orders ED Discharge Orders     None         Valarie Merino, MD 08/20/22 1419

## 2022-08-20 NOTE — ED Triage Notes (Signed)
Patient brought into the ER by EMS due to having a seizure. Patient reports history of seizures and has missed a few doses of his medications. Seizure witnessed while working on a fence. Laceration to the inside of left ear. Was on his knee when seizure happened and fell forward. Patient was post ictal upon EMS arrival. Pt reports feeling fatigued, which is normal after his seizures.

## 2022-08-20 NOTE — Discharge Instructions (Addendum)
   Return for any problem.  Follow-up closely with your neurologist as instructed.  Continue previously prescribed antiepileptics.  Avoid high risk activities - such as driving, operating heavy machinery, climbing to great heights -until cleared by your neurologist.

## 2022-08-21 LAB — LAMOTRIGINE LEVEL: Lamotrigine Lvl: <1 ug/mL — ABNORMAL LOW (ref 2.0–20.0)

## 2022-08-21 LAB — LEVETIRACETAM LEVEL: Levetiracetam Lvl: <2 ug/mL — ABNORMAL LOW (ref 10.0–40.0)

## 2022-08-21 LAB — TOPIRAMATE LEVEL: Topiramate Lvl: <1.5 ug/mL — ABNORMAL LOW (ref 2.0–25.0)

## 2022-10-04 ENCOUNTER — Other Ambulatory Visit: Payer: Self-pay

## 2022-10-04 ENCOUNTER — Emergency Department (HOSPITAL_COMMUNITY)
Admission: EM | Admit: 2022-10-04 | Discharge: 2022-10-04 | Disposition: A | Discharge status: Home / Self Care | Payer: Medicare Other | Attending: Emergency Medicine | Admitting: Emergency Medicine

## 2022-10-04 ENCOUNTER — Encounter (HOSPITAL_COMMUNITY): Payer: Self-pay | Admitting: Emergency Medicine

## 2022-10-04 DIAGNOSIS — G4089 Other seizures: Secondary | ICD-10-CM | POA: Diagnosis present

## 2022-10-04 DIAGNOSIS — R569 Unspecified convulsions: Secondary | ICD-10-CM

## 2022-10-04 LAB — COMPREHENSIVE METABOLIC PANEL
ALT: 43 U/L (ref 0–44)
AST: 44 U/L — ABNORMAL HIGH (ref 15–41)
Albumin: 4.3 g/dL (ref 3.5–5.0)
Alkaline Phosphatase: 44 U/L (ref 38–126)
Anion gap: 14 (ref 5–15)
BUN: 18 mg/dL (ref 6–20)
CO2: 18 mmol/L — ABNORMAL LOW (ref 22–32)
Calcium: 8.9 mg/dL (ref 8.9–10.3)
Chloride: 109 mmol/L (ref 98–111)
Creatinine, Ser: 1.27 mg/dL — ABNORMAL HIGH (ref 0.61–1.24)
GFR, Estimated: >60 mL/min (ref >60)
Glucose, Bld: 104 mg/dL — ABNORMAL HIGH (ref 70–99)
Potassium: 4.3 mmol/L (ref 3.5–5.1)
Sodium: 141 mmol/L (ref 135–145)
Total Bilirubin: 0.9 mg/dL (ref 0.3–1.2)
Total Protein: 6.8 g/dL (ref 6.5–8.1)

## 2022-10-04 LAB — CBC WITH DIFFERENTIAL/PLATELET
Abs Immature Granulocytes: 0.02 10*3/uL (ref 0.00–0.07)
Basophils Absolute: 0.1 10*3/uL (ref 0.0–0.1)
Basophils Relative: 1 %
Eosinophils Absolute: 0 10*3/uL (ref 0.0–0.5)
Eosinophils Relative: 1 %
HCT: 43.3 % (ref 39.0–52.0)
Hemoglobin: 14.7 g/dL (ref 13.0–17.0)
Immature Granulocytes: 0 %
Lymphocytes Relative: 19 %
Lymphs Abs: 1.2 10*3/uL (ref 0.7–4.0)
MCH: 31.6 pg (ref 26.0–34.0)
MCHC: 33.9 g/dL (ref 30.0–36.0)
MCV: 93.1 fL (ref 80.0–100.0)
Monocytes Absolute: 0.5 10*3/uL (ref 0.1–1.0)
Monocytes Relative: 7 %
Neutro Abs: 4.7 10*3/uL (ref 1.7–7.7)
Neutrophils Relative %: 72 %
Platelets: 83 10*3/uL — ABNORMAL LOW (ref 150–400)
RBC: 4.65 MIL/uL (ref 4.22–5.81)
RDW: 13.4 % (ref 11.5–15.5)
WBC: 6.4 10*3/uL (ref 4.0–10.5)
nRBC: 0 % (ref 0.0–0.2)

## 2022-10-04 MED ORDER — LACTATED RINGERS IV BOLUS
1000.0000 mL | Freq: Once | INTRAVENOUS | Status: AC
  Administered 2022-10-04: 1000 mL via INTRAVENOUS

## 2022-10-04 NOTE — ED Triage Notes (Signed)
PT BIB [facility] for seizure.  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  Niece stepped away found pt down on ground upon return.  Unwitnessed seizure assumed.  Niece then witnessed approx. 2-min of seizure like activity.  PT has hx of tonic-clonic seizures with Keppra use.  Unknown compliance per EMS.  PT was postictal on scene but alert and A&O x4 on arrival.  No seizure activity for EMS.  NO meds given en route. No obvious injuries.   EMS VS: 134/82 94% RA, HR 108 regular w/PVC's, RR 20 CBG 117.

## 2022-10-04 NOTE — ED Provider Notes (Signed)
[place] EMERGENCY DEPARTMENT AT [facility] Provider Note  Medical Decision Making   HPI: Louis Nunez is a 52 y.o. male with history perinent seizure disorder on Lamictal, Keppra, Topamax who presents complaining of seizure. Patient arrived via EMS from scene.  History provided by patient, chart review.  No interpreter required for this encounter.  Patient reports that he does not specifically remember what happens, EMS not present at the bedside upon history.  Reports that he was working outside, from his understanding (consistent with triage note from nursing) he was outside working, was found down by his niece with presumed seizure-like activity, then had approximately 2 minutes of witnessed generalized tonic-clonic seizure-like activity.  Currently was postictal upon EMS arrival but did not receive any medications en route, reports that he returned to his baseline while he was en route, however since he was already in the ambulance he came to the hospital.  Reports that he has been adherent to his home medications, has a follow-up with his neurologist next week, reports baseline seizure frequency every 3 to 6 months, has had a VNS for approximately 15 years.  Reports his last seizure was approximately 6 months ago.  Denies any sick symptoms any headache, neck pain, back pain, chest pain, shortness of breath, fevers, chills, abdominal pain, nausea, vomiting, diarrhea, dysuria.  ROS: As per HPI. Please see MAR for complete past medical history, surgical history, and social history.   Physical exam is pertinent for no acute findings, normal neurologic exam.   The differential includes but is not limited to medication nonadherence, breakthrough seizure, lowered seizure threshold due to infection/electrolyte abnormality.  Additional history obtained from: Chart review External records from outside source obtained and reviewed including: None  ED provider interpretation of ECG: Rate  93, sinus rhythm, nonspecific T wave inversions in leads III, V1, T wave flattening in lead aVF, V3.  No ST elevations or depressions.  Overall similar to EKG from 04/2012  ED provider interpretation of radiology/imaging: Not indicated  Labs ordered were interpreted by myself as well as my attending and were incorporated into the medical decision making process for this patient.  ED provider interpretation of labs: CBC without leukocytosis, anemia.  Mild thrombocytopenia.  CMP without emergent electrolyte derangement, mild elevation of creatinine at 1.27 from baseline of approximately 0.8.  Interventions: LR bolus  See the EMR for full details regarding lab and imaging results.  On initial evaluation, patient is overall well-appearing.  Has a history of seizure disorder, seizures today are consistent with reported baseline seizure frequency of every 3 to 6 months.  Patient does not have any complaint, no localizing symptoms or evidence of injuries on exam, he is negative for [place] CT head and CT C-spine normal, do not feel that patient requires CT imaging of these anatomic areas at this time.  Denies any medication nonadherence.  No specific signs or symptoms or historical factors suspicious for underlying etiology to lower seizure threshold, will obtain screening labs to evaluate for gross abnormalities.  Patient remained well-appearing without recurrent seizure-like activity while in the ED.  Labs are remarkable for mild thrombocytopenia which is new as well as mild elevation of kidney function.  Discussed these findings with the patient at bedside, patient does work outside most days, there have been several hot days recently, patient does not always bring water with him when he is working outside.  Discussed increased oral intake, particularly when working in the sun on hot days.  Discussed follow-up labs  with PCP.  Patient feels safe with discharge and follow-up outpatient with neurologist,  reportedly has appointment next week, discharged in stable condition.     Consults: Not indicated  Disposition: DISCHARGE: I believe that the patient is safe for discharge home with outpatient follow-up. Patient was informed of all pertinent physical exam, laboratory, and imaging findings including incidental findings on labs.  Patient's suspected etiology of their symptom presentation was discussed with the patient and all questions were answered. We discussed following up with PCP, neurologist. I provided thorough ED return precautions. The patient feels safe and comfortable with this plan.  The plan for this patient was discussed with Dr. Anitra Lauth, who voiced agreement and who oversaw evaluation and treatment of this patient.  Clinical Impression:  1. Witnessed seizure-like activity [facility])    Discharge  Therapies: These medications and interventions were provided for the patient while in the ED. Medications  lactated ringers bolus 1,000 mL (1,000 mLs Intravenous New Bag/Given 10/04/22 1532)    MDM generated using voice dictation software and may contain dictation errors.  Please contact me for any clarification or with any questions.  Clinical Complexity A medically appropriate history, review of systems, and physical exam was performed.  Collateral history obtained from: chart review I personally reviewed the labs, EKG, imaging as discussed above. Patient's presentation is most consistent with exacerbation of chronic illness Considered and ruled out life and body threatening conditions  Treatment: Outpatient follow-up Patient's lack of recollection of the episode increases the complexity of managing their presentation. Medications: None Discussed patient's care with providers from the following different specialties: None  Physical Exam   ED Triage Vitals  Enc Vitals Group     BP 10/04/22 1448 118/80     Pulse Rate 10/04/22 1448 90     Resp 10/04/22 1448 20     Temp  10/04/22 1451 98.4 F (36.9 C)     Temp Source 10/04/22 1451 Oral     SpO2 10/04/22 1448 96 %     Weight 10/04/22 1449 160 lb (72.6 kg)     Height 10/04/22 1449 5\' 6"  (1.676 m)     Head Circumference --      Peak Flow --      Pain Score 10/04/22 1449 0     Pain Loc --      Pain Edu? --      Excl. in GC? --      Physical Exam Vitals and nursing note reviewed.  Constitutional:      General: He is not in acute distress.    Appearance: Normal appearance. He is well-developed.  HENT:     Head: Normocephalic and atraumatic.  Eyes:     Extraocular Movements: Extraocular movements intact.     Conjunctiva/sclera: Conjunctivae normal.     Pupils: Pupils are equal, round, and reactive to light.  Cardiovascular:     Rate and Rhythm: Normal rate and regular rhythm.     Pulses: Normal pulses.     Heart sounds: No murmur heard. Pulmonary:     Effort: Pulmonary effort is normal. No respiratory distress.     Breath sounds: Normal breath sounds.  Abdominal:     General: Abdomen is flat. Bowel sounds are normal. There is no distension.     Palpations: Abdomen is soft.     Tenderness: There is no abdominal tenderness. There is no guarding or rebound.  Musculoskeletal:        General: No swelling.     Cervical  back: Neck supple. No bony tenderness.     Thoracic back: No bony tenderness.     Lumbar back: No bony tenderness.  Skin:    General: Skin is warm and dry.     Capillary Refill: Capillary refill takes less than 2 seconds.  Neurological:     General: No focal deficit present.     Mental Status: He is alert and oriented to person, place, and time. Mental status is at baseline.     Cranial Nerves: No cranial nerve deficit.     Sensory: No sensory deficit.     Motor: No weakness.  Psychiatric:        Mood and Affect: Mood normal.       Procedure Note  Procedures  No orders to display    Julianne Rice, MD Emergency Medicine, PGY-2   Curley Spice, MD 10/04/22 1610     Gwyneth Sprout, MD 10/05/22 1559

## 2022-10-04 NOTE — Discharge Instructions (Addendum)
Louis Nunez  Thank you for allowing [place] to take care of you today.  You came to the Emergency Department today because had an episode while you are out working today where you fell over and it sounds like your niece saw you having some seizure-like activity.  Here in the emergency department you are back to your usual self, you do not have any abnormalities on exam.  We did some screening labs, your platelets are mildly low, and your kidney function is mildly elevated, you are likely a bit dehydrated from where you have been working in the sun past several days, we recommend increasing your oral intake of fluids, particularly when you are working outside in the sun on hot days.  You should follow-up with these lab results with your primary care physician.  You should also continue to follow-up with your neurologist next week.Marland Kitchen  To-Do: 1. Please follow-up with your primary doctor within 2 days / as soon as possible.   Please return to the Emergency Department or call 911 if you experience have worsening of your symptoms, or do not get better, chest pain, shortness of breath, severe or significantly worsening pain, high fever, severe confusion, pass out or have any reason to think that you need emergency medical care.   We hope you feel better soon.   Curley Spice, MD Department of Emergency Medicine [facility]    Seizure precautions:  --[facility] specifies that you may not Drive for 6 months after your most recent seizure or spell with loss of consciousness. --Take showers instead of baths due to the risk of drowning. If the patient is in the bathtub, constant supervision is recommended at all times.  --Direct supervision is also recommended at all times when swimming or when in or around bodies of water due to the risk of drowning.  --No climbing heights due to the risk of falling. --No driving or operating heavy machinery. --Direct supervision required around stoves,  ovens, fireplaces, campfires, or other sources of heat or fire.  --Always wear a helmet when riding a bicycle. --To decrease your chance of having seizures be sure to get plenty of sleep, take medications regularly without missing doses, eat regular meals, and abstain from alcohol and drugs.

## 2022-12-26 IMAGING — CT CT CERVICAL SPINE W/O CM
3 of 4 series · 12 of 33 positions shown, 14 images · non-contrast
Comparison: No pertinent prior exams available for comparison.

CLINICAL DATA: Seizure, nontraumatic. Seizure, fall, head injury.
Facial trauma, seizure, fall, nasal tenderness.

EXAM:
CT HEAD WITHOUT CONTRAST
CT MAXILLOFACIAL WITHOUT CONTRAST
CT CERVICAL SPINE WITHOUT CONTRAST
TECHNIQUE: Multidetector CT imaging of the head, cervical spine, and
maxillofacial structures were performed using the standard protocol
without intravenous contrast. Multiplanar CT image reconstructions
of the cervical spine and maxillofacial structures were also
generated.

[Series 4: sagittal bone · sagittal · 0.25mm/px · 5 of 65 slices shown, 6 images]
[im 22/65  bone]
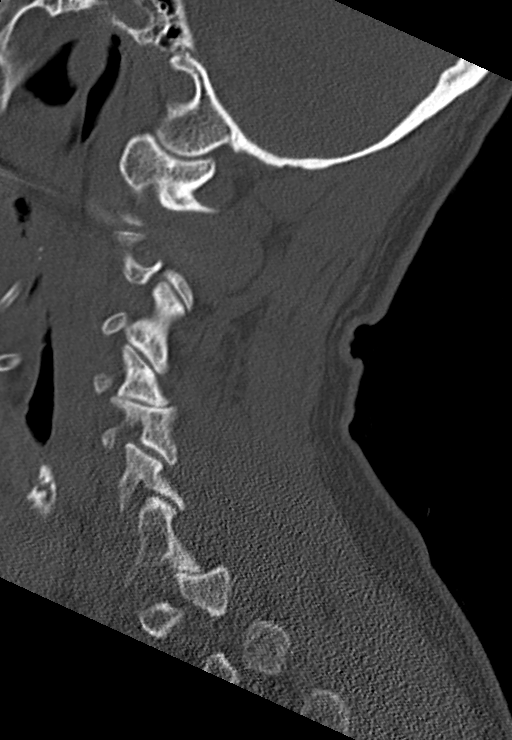
[im 27/65  bone]
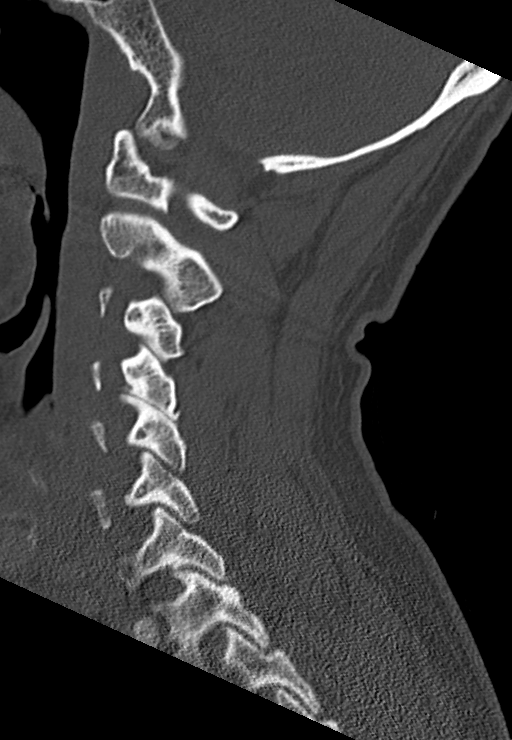
[im 33/65  soft-tissue]
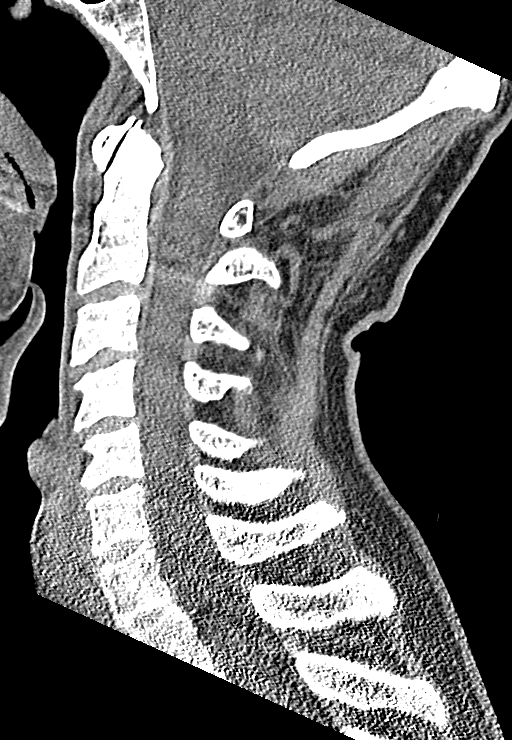
[im 33/65  bone]
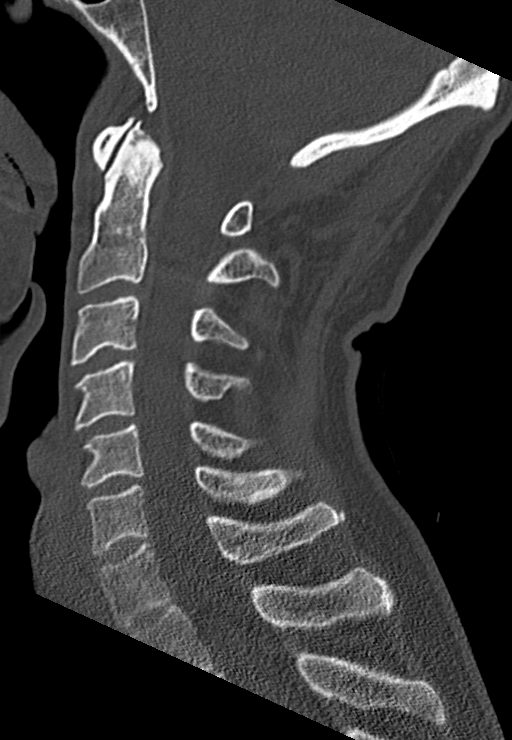
[im 38/65  bone]
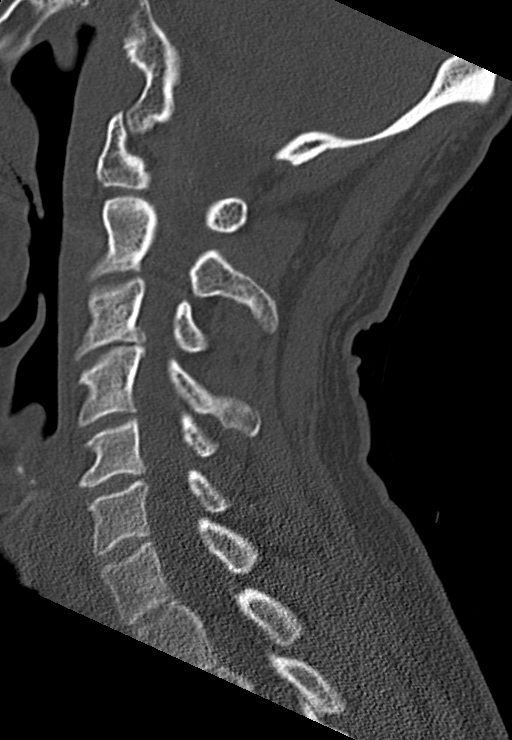
[im 43/65  bone]
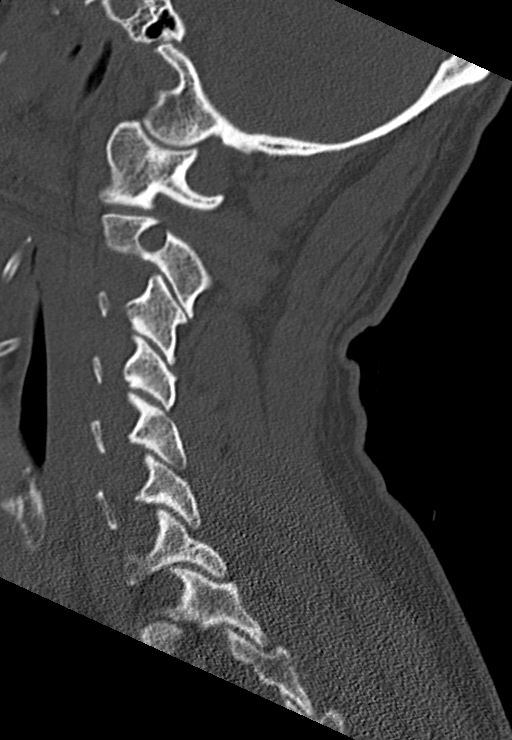

[Series 5: coronal bone · coronal · 0.28mm/px · 3 of 85 slices shown]
[im 27/85  bone]
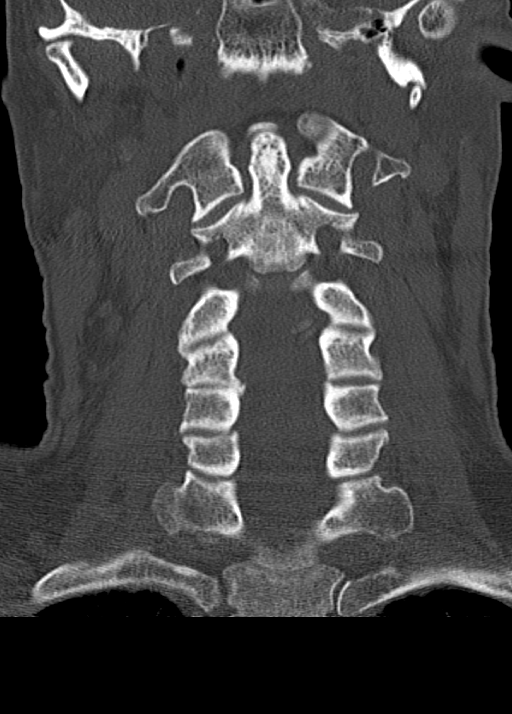
[im 37/85  bone]
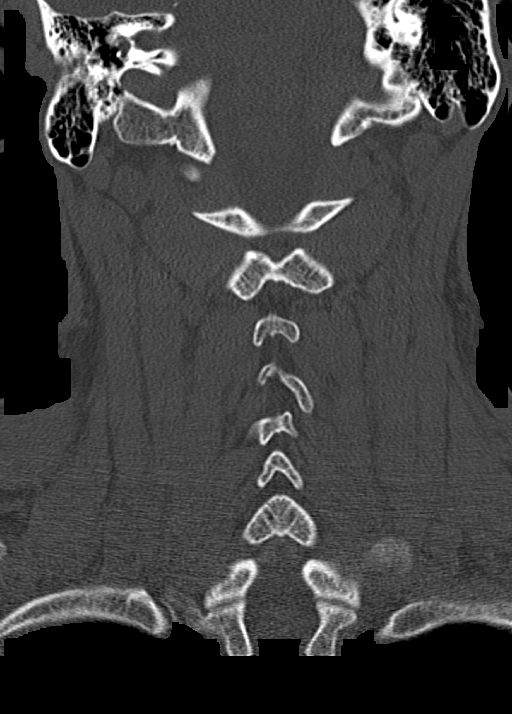
[im 48/85  bone]
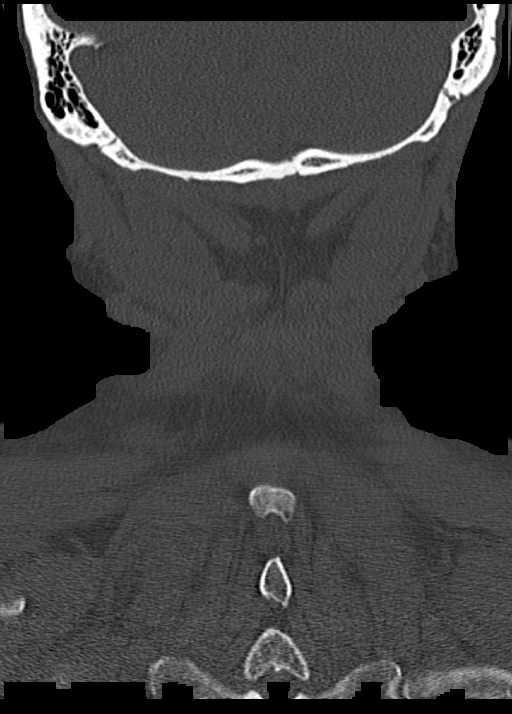

[Series 6: orthogonal axials · axial · 0.23mm/px · z∈[-314,-188]mm · 4 of 99 slices shown, 5 images]
[im 15/99  soft-tissue]
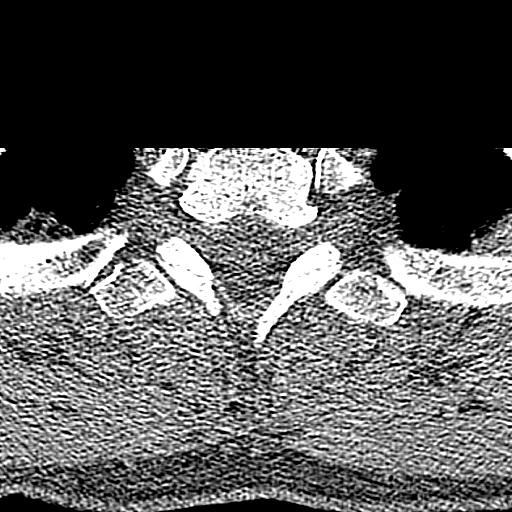
[im 15/99  bone]
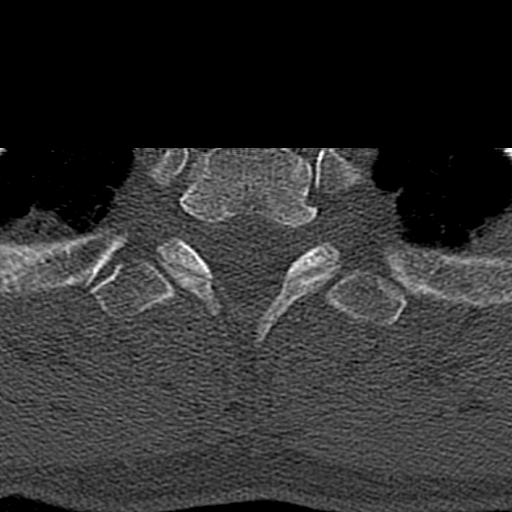
[im 43/99  bone]
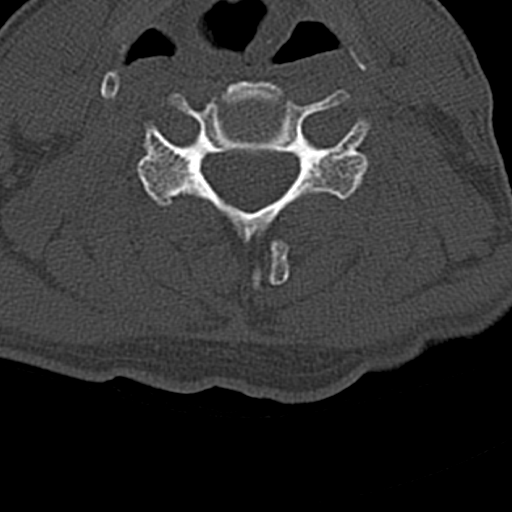
[im 57/99  bone]
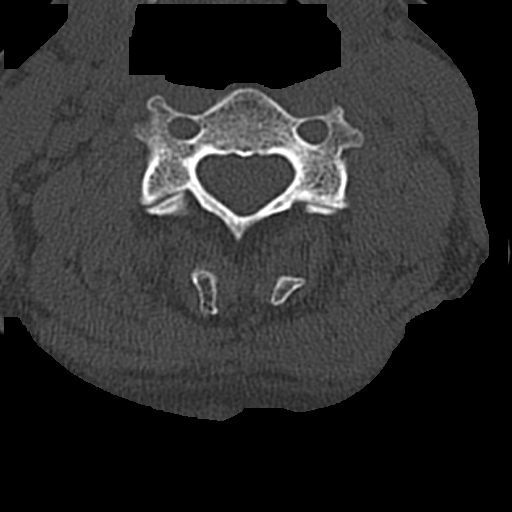
[im 85/99  bone]
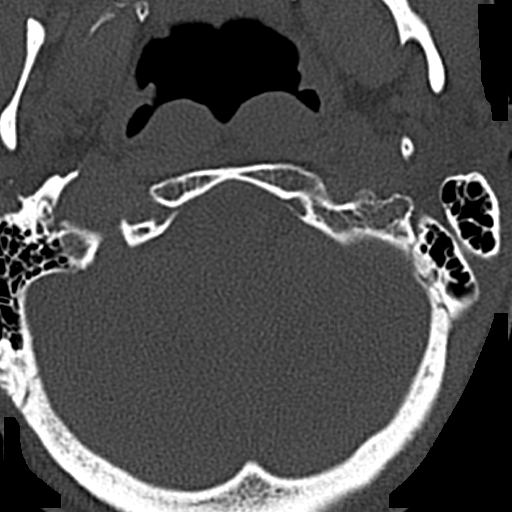

[12 of 33 positions shown; findings below may reference images not displayed]

FINDINGS: CT HEAD FINDINGS

Brain:

Cerebral volume is normal.

There is no acute intracranial hemorrhage.

No demarcated cortical infarct.

No extra-axial fluid collection.

No evidence of intracranial mass.

No midline shift.

Vascular: No hyperdense vessel.  Atherosclerotic calcifications

Skull: Normal. Negative for fracture or focal lesion.

Other: No significant mastoid effusion at the imaged levels.

CT MAXILLOFACIAL FINDINGS

Osseous: Age-indeterminate minimally displaced fractures of the
bilateral nasal bones. Linear defect through the left styloid
process, which may be developmental (series 6, image 16). An acute
fracture at this site cannot be excluded. No other maxillofacial
fracture is identified.

Orbits: No acute finding within the orbits. The globes are normal in
size and contour. The extraocular muscles and optic nerve sheath
complexes are symmetric and unremarkable.

Sinuses: Trace mucosal thickening within the bilateral maxillary
sinuses.

Soft tissues: No significant maxillofacial soft tissue swelling
appreciable by CT.

CT CERVICAL SPINE FINDINGS

Alignment: No significant spondylolisthesis.

Skull base and vertebrae: The basion-dental and atlanto-dental
intervals are maintained.No evidence of acute fracture to the
cervical spine.

Soft tissues and spinal canal: No prevertebral fluid or swelling. No
visible canal hematoma.

Disc levels: Cervical spondylosis. No more than mild disc space
narrowing at any level. Shallow multilevel disc bulges. No
appreciable high-grade spinal canal stenosis. No compressive bony
neural foraminal narrowing.

Upper chest: No consolidation within the imaged lung apices.

Other: Partially imaged vagal nerve stimulator within the left lower
neck/left chest.
IMPRESSION: CT head:

No evidence of acute intracranial abnormality.

CT maxillofacial:

1. Age-indeterminate minimally displaced fractures of the bilateral
nasal bones.
2. Linear defect through the left styloid process. This may be
developmental. However, an acute fracture at this site cannot be
excluded and clinical correlation is recommended.
3. Trace bilateral maxillary sinus mucosal thickening.

CT cervical spine:

1. No evidence of acute fracture to the cervical spine.
2. Cervical spondylosis, as described.

## 2022-12-26 IMAGING — CR DG CHEST 2V
2 series · 2 of 2 positions shown · non-contrast
Comparison: Prior chest radiographs 04/15/2012.

CLINICAL DATA: Syncope.  Witness seizure today while mowing yard.

EXAM:
CHEST - 2 VIEW

[w chest pa]
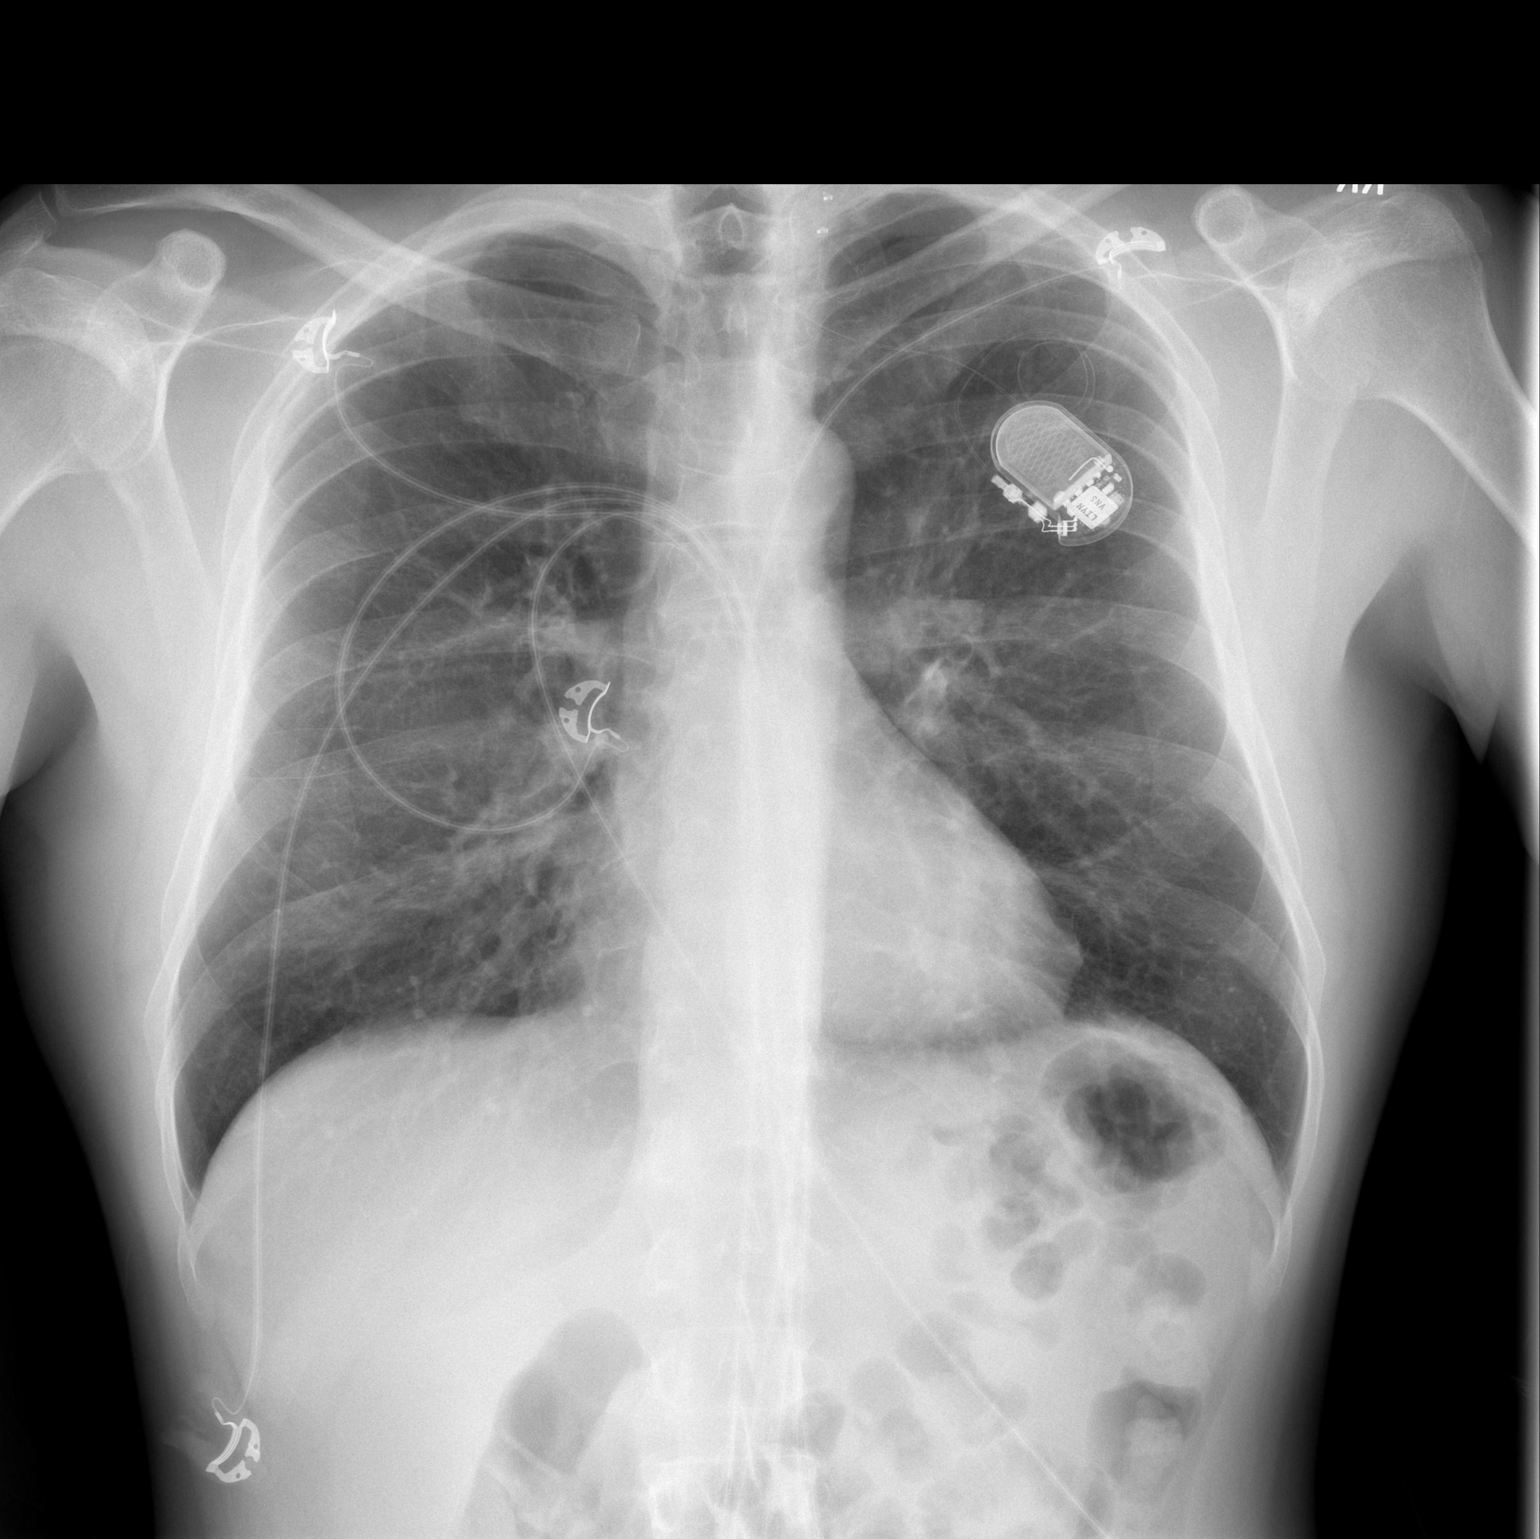

[w chest lat]
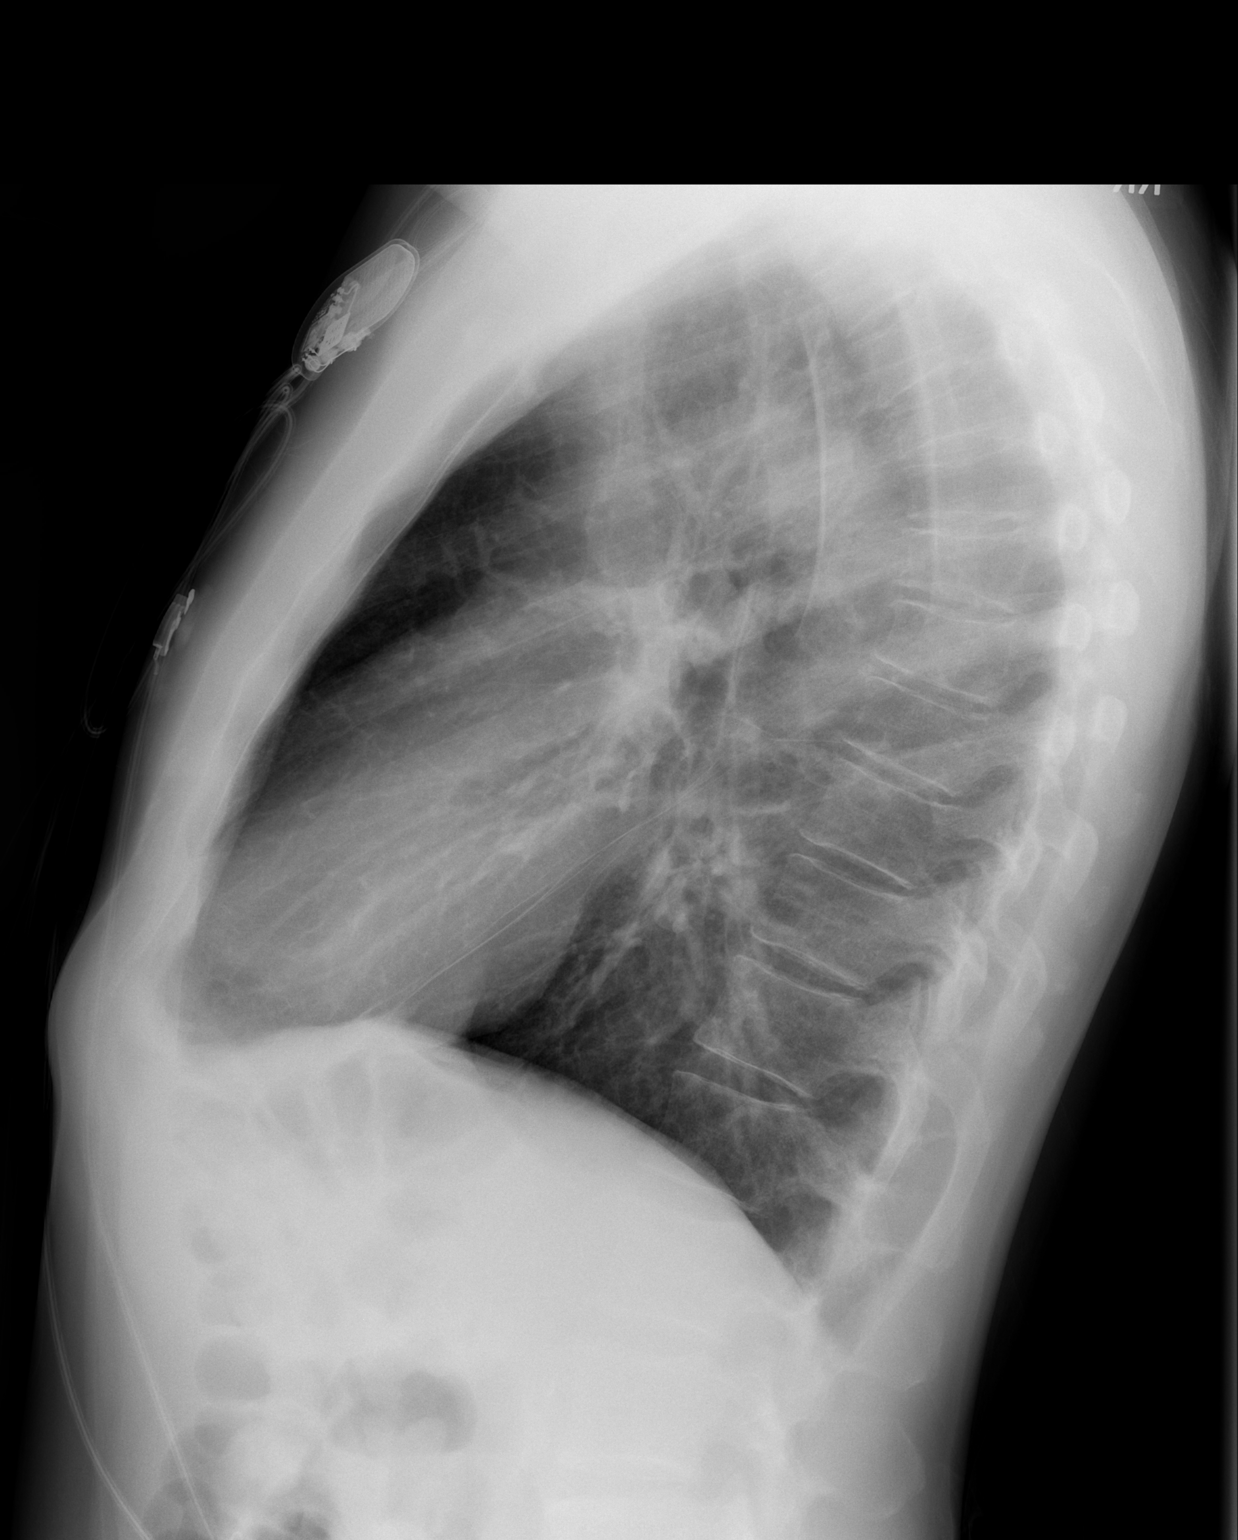

[2 of 2 positions shown; findings below may reference images not displayed]

FINDINGS: A vagal nerve stimulator overlies the left chest and left lower
neck. Heart size within normal limits. No appreciable airspace
consolidation. No evidence of pleural effusion or pneumothorax. No
acute bony abnormality identified.
IMPRESSION: No evidence of active cardiopulmonary disease.
